# Patient Record
Sex: Female | Born: 1990 | Race: White | Hispanic: No | Marital: Single
Health system: Midwestern US, Community
[De-identification: ages and names within clinical notes are randomized; demographics above are authoritative.]

---
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
## 2018-01-02 ENCOUNTER — Inpatient Hospital Stay: Admit: 2018-01-02 | Primary: Internal Medicine

## 2020-01-04 ENCOUNTER — Inpatient Hospital Stay
Admit: 2020-01-04 | Discharge: 2020-01-05 | Disposition: A | Discharge status: Home / Self Care | Payer: BLUE CROSS/BLUE SHIELD | Attending: Emergency Medicine

## 2020-01-04 ENCOUNTER — Emergency Department: Admit: 2020-01-05 | Payer: BLUE CROSS/BLUE SHIELD | Primary: Internal Medicine

## 2020-01-04 DIAGNOSIS — S0990XA Unspecified injury of head, initial encounter: Secondary | ICD-10-CM

## 2020-01-04 NOTE — ED Provider Notes (Addendum)
This is a 29 year old female that tripped while walking about 3 hours prior to admission.  She pitched forward and struck left forehead.  Patient's boyfriend was with her.  States she was not knocked out but had perhaps a bit of confusion for a few seconds.  No vomiting ataxia or lethargy.  Patient presents with increasing headache with some nausea.  She does not remember falling.  She also complains of pain in her right wrist and left knee and also chipped her teeth.  Some lacerations about the face as well.  Immunizations are up-to-date.  Denies possibility pregnancy regarding x-rays.  Patient is healthy.  Past medical history is negative.  Denies past surgical history.    The history is provided by the patient and a friend.   Fall  The accident occurred 3 to 5 hours ago. The fall occurred while standing. She fell from a height of ground level. She landed on concrete. The point of impact was the head and right wrist. The pain is present in the left knee. The pain is moderate. She was ambulatory at the scene. Associated symptoms include headaches and laceration. Pertinent negatives include no visual change, no fever, no numbness, no abdominal pain, no nausea, no vomiting, no extremity weakness, no loss of consciousness and no tingling. The symptoms are aggravated by activity. She has tried acetaminophen for the symptoms. The patient's last tetanus shot was less than 5 years ago.  Laceration   Pertinent negatives include no numbness, no tingling and no weakness.        History reviewed. No pertinent past medical history.    History reviewed. No pertinent surgical history.      History reviewed. No pertinent family history.    Social History     Socioeconomic History   ??? Marital status: SINGLE     Spouse name: Not on file   ??? Number of children: Not on file   ??? Years of education: Not on file   ??? Highest education level: Not on file   Occupational History   ??? Not on file   Social Needs   ??? Financial resource strain:  Not on file   ??? Food insecurity     Worry: Not on file     Inability: Not on file   ??? Transportation needs     Medical: Not on file     Non-medical: Not on file   Tobacco Use   ??? Smoking status: Never Smoker   ??? Smokeless tobacco: Never Used   Substance and Sexual Activity   ??? Alcohol use: Yes   ??? Drug use: Never   ??? Sexual activity: Not on file   Lifestyle   ??? Physical activity     Days per week: Not on file     Minutes per session: Not on file   ??? Stress: Not on file   Relationships   ??? Social Wellsite geologist on phone: Not on file     Gets together: Not on file     Attends religious service: Not on file     Active member of club or organization: Not on file     Attends meetings of clubs or organizations: Not on file     Relationship status: Not on file   ??? Intimate partner violence     Fear of current or ex partner: Not on file     Emotionally abused: Not on file     Physically abused: Not on  file     Forced sexual activity: Not on file   Other Topics Concern   ??? Not on file   Social History Narrative   ??? Not on file         ALLERGIES: Tape [adhesive]    Review of Systems   Constitutional: Negative for chills and fever.   Eyes: Negative for visual disturbance.   Respiratory: Negative for shortness of breath.    Cardiovascular: Negative for chest pain.   Gastrointestinal: Negative for abdominal pain, nausea and vomiting.   Musculoskeletal: Negative for extremity weakness.   Neurological: Positive for headaches. Negative for tingling, loss of consciousness, weakness and numbness.       Vitals:    01/04/20 1938   BP: 108/68   Pulse: (!) 104   Resp: 15   Temp: 98.4 ??F (36.9 ??C)   SpO2: 95%            Physical Exam  Vitals signs and nursing note reviewed.   Constitutional:       Appearance: She is not ill-appearing.   HENT:      Head: Normocephalic.        Right Ear: Tympanic membrane normal.      Left Ear: Tympanic membrane normal.      Nose: Nose normal.      Mouth/Throat:      Mouth: Mucous membranes are  moist.      Dentition: Abnormal dentition.      Pharynx: Oropharynx is clear. No pharyngeal swelling.     Eyes:      Extraocular Movements: Extraocular movements intact.      Pupils: Pupils are equal, round, and reactive to light.   Neck:      Musculoskeletal: Normal range of motion and neck supple. No muscular tenderness.   Musculoskeletal:      Right wrist: She exhibits decreased range of motion, tenderness and bony tenderness.      Left knee: No tenderness found.      Comments: Right wrist with some volar abrasions and tenderness.  Minimal soft tissue swelling.  Distal neurovascular intact.  Left knee with nontender abrasion anteriorly.  Full range of motion no pain.  Amatory without difficulty.  No effusion.   Skin:     General: Skin is warm and dry.      Findings: Laceration present.   Neurological:      Mental Status: She is alert.      GCS: GCS eye subscore is 4. GCS verbal subscore is 5. GCS motor subscore is 6.      Deep Tendon Reflexes:      Reflex Scores:       Patellar reflexes are 2+ on the right side and 2+ on the left side.     Comments: Normal speech and gait.  Amnestic of event.  No drift.          MDM  Number of Diagnoses or Management Options  Diagnosis management comments: Concern for concussion given amnestic of the event.  Head CT.  Imaging right wrist.  Right wrist is without snuffbox tenderness.       Amount and/or Complexity of Data Reviewed  Tests in the radiology section of CPT??: ordered and reviewed    Risk of Complications, Morbidity, and/or Mortality  Presenting problems: moderate  Diagnostic procedures: low  Management options: low    Patient Progress  Patient progress: stable         Procedures      Results Include:  No results found for this or any previous visit (from the past 24 hour(s)).    Ct Head Wo Cont    Result Date: 01/04/2020  EXAM: CT HEAD WO CONT HISTORY:  Fall, left temporal headache.  TECHNIQUE: Axial images of the brain were performed without the administration of  intravenous contrast. Images were obtained axial plane and coronal reformatted images were submitted. CT scan performed using appropriate/available dose optimization/reduction/ALARA techniques. COMPARISON: None. FINDINGS: There is no evidence of acute intracranial hemorrhage, midline shift, or mass effect. No intra or extra-axial fluid collections are observed. No evidence of acute confluent territorial infarction. Ventricles and basal cisterns are patent and symmetric. Visualized paranasal sinuses and mastoid air cells are without significant fluid. Scalp, soft tissues, and calvarium are within normal limits.     1. No CT evidence of acute intracranial process.     My interpretation of 3 view x-ray of the right wrist does not reveal any fracture.  No soft tissue swelling.  Good alignment.  No foreign bodies.      Patient lives out of town.  Has a dentist.  Boyfriend will observe her tonight.

## 2020-01-04 NOTE — ED Triage Notes (Signed)
Pt to er c/o falling while walking/tripped on pavment/feet.  abraision to face, rt hand and left knee, 2 broken teeth, sts no other teeth are loose.

## 2020-01-04 NOTE — ED Notes (Signed)
Pt sts last tetnus shot 1.5 years ago

## 2020-01-04 NOTE — ED Notes (Signed)
I have reviewed discharge instructions with the patient.  The patient verbalized understanding.    Patient left ED via Discharge Method: ambulatory to Home with SO.     Opportunity for questions and clarification provided.     Patient given 2 scripts.

## 2020-01-04 NOTE — ED Notes (Signed)
Pt sts she has had 5 mixed drinks today

## 2020-01-04 NOTE — ED Notes (Signed)
Pt to er c/o falling while walking/tripped on pavment/feet.  abraision to face, rt hand and left knee, 2 broken teeth, sts no other teeth are loose.

## 2020-01-04 NOTE — ED Provider Notes (Signed)
ED Provider Notes by Roxan Hockey, MD at 01/04/20 2036                Author: Roxan Hockey, MD  Service: --  Author Type: Physician       Filed: 01/04/20 2122  Date of Service: 01/04/20 2036  Status: Addendum          Editor: Roxan Hockey, MD (Physician)          Related Notes: Original Note by Roxan Hockey, MD (Physician) filed at 01/04/20 2113               This is a 29 year old female that tripped while walking about 3 hours prior to admission.  She pitched forward  and struck left forehead.  Patient's boyfriend was with her.  States she was not knocked out but had perhaps a bit of confusion for a few seconds.  No vomiting ataxia or lethargy.  Patient presents with increasing headache with some nausea.  She does  not remember falling.  She also complains of pain in her right wrist and left knee and also chipped her teeth.  Some lacerations about the face as well.  Immunizations are up-to-date.  Denies possibility pregnancy regarding x-rays.   Patient is healthy.  Past medical history is negative.  Denies past surgical history.      The history is provided by the patient and a friend.    Fall   The  accident occurred 3 to 5 hours ago. The fall occurred while standing.  She fell from a height of ground level. She landed on concrete. The point of impact was the head and right wrist. The pain is present in the left knee. The  pain is moderate. She was ambulatory at the scene. Associated symptoms include headaches and laceration. Pertinent negatives include no visual change, no fever, no  numbness, no abdominal pain, no nausea, no vomiting, no extremity weakness, no loss of consciousness and no tingling. The symptoms are aggravated by activity. She  has tried acetaminophen for the symptoms. The patient's last tetanus shot was less than 5 years ago.   Laceration    Pertinent  negatives include no numbness, no tingling and no weakness.           History reviewed. No pertinent past medical history.       History reviewed. No pertinent surgical history.        History reviewed. No pertinent family history.        Social History          Socioeconomic History         ?  Marital status:  SINGLE              Spouse name:  Not on file         ?  Number of children:  Not on file     ?  Years of education:  Not on file     ?  Highest education level:  Not on file       Occupational History        ?  Not on file       Social Needs         ?  Financial resource strain:  Not on file        ?  Food insecurity              Worry:  Not on file  Inability:  Not on file        ?  Transportation needs              Medical:  Not on file         Non-medical:  Not on file       Tobacco Use         ?  Smoking status:  Never Smoker     ?  Smokeless tobacco:  Never Used       Substance and Sexual Activity         ?  Alcohol use:  Yes     ?  Drug use:  Never     ?  Sexual activity:  Not on file       Lifestyle        ?  Physical activity              Days per week:  Not on file         Minutes per session:  Not on file         ?  Stress:  Not on file       Relationships        ?  Social Engineer, manufacturing systems on phone:  Not on file         Gets together:  Not on file         Attends religious service:  Not on file         Active member of club or organization:  Not on file         Attends meetings of clubs or organizations:  Not on file         Relationship status:  Not on file        ?  Intimate partner violence              Fear of current or ex partner:  Not on file         Emotionally abused:  Not on file         Physically abused:  Not on file         Forced sexual activity:  Not on file        Other Topics  Concern        ?  Not on file       Social History Narrative        ?  Not on file              ALLERGIES: Tape [adhesive]      Review of Systems    Constitutional: Negative for chills and fever.    Eyes: Negative for visual disturbance.    Respiratory: Negative for shortness of breath.     Cardiovascular:  Negative for chest pain.    Gastrointestinal: Negative for abdominal pain, nausea and vomiting.    Musculoskeletal: Negative for extremity weakness.    Neurological: Positive for headaches. Negative for tingling, loss of consciousness, weakness and numbness.            Vitals:          01/04/20 1938        BP:  108/68     Pulse:  (!) 104     Resp:  15     Temp:  98.4 ??F (36.9 ??C)        SpO2:  95%  Physical Exam   Vitals signs and nursing note reviewed.   Constitutional:        Appearance: She is not ill-appearing.   HENT :       Head: Normocephalic.          Right Ear: Tympanic membrane normal.      Left Ear: Tympanic membrane normal.      Nose: Nose normal.      Mouth/Throat:      Mouth: Mucous membranes are moist.       Dentition: Abnormal dentition.      Pharynx: Oropharynx is clear. No pharyngeal swelling.       Eyes :       Extraocular Movements: Extraocular movements intact.      Pupils: Pupils are equal, round, and reactive to light.    Neck:       Musculoskeletal: Normal range of motion and neck supple. No muscular tenderness.   Musculoskeletal:      Right wrist: She exhibits decreased  range of motion, tenderness and  bony tenderness.      Left knee: No tenderness found.      Comments: Right wrist with some volar abrasions and tenderness.  Minimal  soft tissue swelling.  Distal neurovascular intact.  Left knee with nontender abrasion anteriorly.  Full range of motion no pain.  Amatory without difficulty.  No effusion.    Skin:      General: Skin is warm and dry.      Findings: Laceration present.   Neurological:       Mental Status: She is alert.      GCS: GCS eye subscore is 4. GCS verbal subscore is  5. GCS motor subscore is 6.      Deep Tendon Reflexes:       Reflex Scores:        Patellar reflexes are 2+ on the right side and 2+ on the left side.     Comments: Normal speech and gait.  Amnestic of event.  No drift.              MDM   Number of Diagnoses or Management Options   Diagnosis  management comments: Concern for concussion given amnestic of the event.  Head CT.  Imaging right wrist.  Right wrist is without snuffbox tenderness.          Amount and/or Complexity of Data Reviewed   Tests in the radiology section of CPT??: ordered and reviewed      Risk of Complications, Morbidity, and/or Mortality   Presenting problems: moderate  Diagnostic procedures: low  Management options: low     Patient Progress   Patient progress: stable             Procedures         Results Include:      No results found for this or any previous visit (from the past 24 hour(s)).      Ct Head Wo Cont      Result Date: 01/04/2020   EXAM: CT HEAD WO CONT HISTORY:  Fall, left temporal headache.  TECHNIQUE: Axial images of the brain were performed without the administration of intravenous contrast. Images were obtained axial plane and coronal reformatted images were submitted. CT scan  performed using appropriate/available dose optimization/reduction/ALARA techniques. COMPARISON: None. FINDINGS: There is no evidence of acute intracranial hemorrhage, midline shift, or mass effect. No intra or extra-axial fluid collections are observed.  No evidence of acute confluent territorial infarction.  Ventricles and basal cisterns are patent and symmetric. Visualized paranasal sinuses and mastoid air cells are without significant fluid. Scalp, soft tissues, and calvarium are within normal limits.       1. No CT evidence of acute intracranial process.       My interpretation of 3 view x-ray of the right wrist does not reveal any fracture.  No soft tissue swelling.  Good alignment.  No foreign bodies.         Patient lives out of town.  Has a dentist.  Boyfriend will observe her tonight.

## 2020-01-04 NOTE — ED Notes (Signed)
Pt sts she has had 5 mixed drinks today

## 2020-01-04 NOTE — ED Notes (Signed)
Pt sts last tetnus shot 1.5 years ago

## 2020-01-05 MED ORDER — IBUPROFEN 800 MG TAB
800 mg | ORAL_TABLET | Freq: Three times a day (TID) | ORAL | 0 refills | Status: AC | PRN
Start: 2020-01-05 — End: 2020-01-09

## 2020-01-05 MED ORDER — ONDANSETRON 4 MG TAB, RAPID DISSOLVE
4 mg | ORAL_TABLET | Freq: Three times a day (TID) | ORAL | 0 refills | Status: AC | PRN
Start: 2020-01-05 — End: ?
